# Patient Record
Sex: Male | Born: 1980 | Race: White | Hispanic: No | Marital: Single | Smoking: Current every day smoker
Health system: Southern US, Community
[De-identification: ages and names within clinical notes are randomized; demographics above are authoritative.]

---

## 2007-06-10 ENCOUNTER — Emergency Department: Payer: Self-pay | Admitting: Unknown Physician Specialty

## 2007-12-26 ENCOUNTER — Emergency Department: Payer: Self-pay | Admitting: Emergency Medicine

## 2008-04-25 ENCOUNTER — Emergency Department (HOSPITAL_COMMUNITY): Admission: EM | Admit: 2008-04-25 | Discharge: 2008-04-25 | Payer: Self-pay | Admitting: Emergency Medicine

## 2008-08-02 ENCOUNTER — Emergency Department: Payer: Self-pay | Admitting: Emergency Medicine

## 2008-10-14 ENCOUNTER — Emergency Department: Payer: Self-pay | Admitting: Emergency Medicine

## 2008-10-16 ENCOUNTER — Emergency Department: Payer: Self-pay | Admitting: Emergency Medicine

## 2010-01-21 ENCOUNTER — Emergency Department: Payer: Self-pay | Admitting: Emergency Medicine

## 2010-11-20 ENCOUNTER — Emergency Department: Payer: Self-pay | Admitting: Emergency Medicine

## 2010-12-21 ENCOUNTER — Emergency Department: Payer: Self-pay | Admitting: Emergency Medicine

## 2011-05-16 ENCOUNTER — Emergency Department: Payer: Self-pay | Admitting: Emergency Medicine

## 2014-03-30 ENCOUNTER — Emergency Department: Payer: Self-pay | Admitting: Emergency Medicine

## 2015-01-16 ENCOUNTER — Emergency Department: Payer: Self-pay

## 2015-01-16 ENCOUNTER — Emergency Department
Admission: EM | Admit: 2015-01-16 | Discharge: 2015-01-16 | Disposition: A | Discharge status: Home / Self Care | Payer: Self-pay | Attending: Emergency Medicine | Admitting: Emergency Medicine

## 2015-01-16 ENCOUNTER — Encounter: Payer: Self-pay | Admitting: Medical Oncology

## 2015-01-16 DIAGNOSIS — M791 Myalgia: Secondary | ICD-10-CM | POA: Insufficient documentation

## 2015-01-16 DIAGNOSIS — Z720 Tobacco use: Secondary | ICD-10-CM | POA: Insufficient documentation

## 2015-01-16 DIAGNOSIS — M542 Cervicalgia: Secondary | ICD-10-CM | POA: Insufficient documentation

## 2015-01-16 MED ORDER — DIAZEPAM 2 MG PO TABS: 2.0000 mg | ORAL_TABLET | Freq: Three times a day (TID) | ORAL | Status: AC | PRN

## 2015-01-16 MED ORDER — KETOROLAC TROMETHAMINE 60 MG/2ML IM SOLN
60.0000 mg | Freq: Once | INTRAMUSCULAR | Status: AC
  Administered 2015-01-16: 60 mg via INTRAMUSCULAR
  Filled 2015-01-16: qty 2

## 2015-01-16 MED ORDER — NAPROXEN 500 MG PO TABS: 500.0000 mg | ORAL_TABLET | Freq: Two times a day (BID) | ORAL | Status: AC

## 2015-01-16 NOTE — ED Provider Notes (Signed)
[facility] Emergency Department Provider Note ____________________________________________  Time seen: Approximately 10:39 AM  I have reviewed the triage vital signs and the nursing notes.   HISTORY  Chief Complaint Neck Pain  HPI Marc Navarro is a 34 y.o. male is here with complaint of neck pain 1 week. Patient denies any history of injury to his neck or prior neck problems. For the last 2 days he states that his neck is becoming more stiff and sore. He states there is a "pulling sensation" when he moves his neck from side to side. He denies any fever or chills, no headache, no nausea or vomiting. Denies any upper respiratory symptoms or sore throat. He has not taken any over-the-counter medication for this. Currently he rates his pain as a 7 out of 10.   History reviewed. No pertinent past medical history.  There are no active problems to display for this patient.   History reviewed. No pertinent past surgical history.  Current Outpatient Rx  Name  Route  Sig  Dispense  Refill  . diazepam (VALIUM) 2 MG tablet   Oral   Take 1 tablet (2 mg total) by mouth every 8 (eight) hours as needed for muscle spasms.   9 tablet   0   . naproxen (NAPROSYN) 500 MG tablet   Oral   Take 1 tablet (500 mg total) by mouth 2 (two) times daily with a meal.   30 tablet   0     Allergies Review of patient's allergies indicates no known allergies.  No family history on file.  Social History Social History  Substance Use Topics  . Smoking status: Current Every Day Smoker  . Smokeless tobacco: None  . Alcohol Use: None    Review of Systems Constitutional: No fever/chills Eyes: No visual changes. ENT: No sore throat. Cardiovascular: Denies chest pain. Respiratory: Denies shortness of breath. Gastrointestinal:  No nausea, no vomiting.   Musculoskeletal: Negative for back pain. Positive neck pain Skin: Negative for rash. Neurological: Negative for  headaches, focal weakness or numbness.  10-point ROS otherwise negative.  ____________________________________________   PHYSICAL EXAM:  VITAL SIGNS: ED Triage Vitals  Enc Vitals Group     BP 01/16/15 0921 143/95 mmHg     Pulse Rate 01/16/15 0921 88     Resp 01/16/15 0921 17     Temp 01/16/15 0921 97.8 F (36.6 C)     Temp Source 01/16/15 0921 Oral     SpO2 01/16/15 0921 98 %     Weight 01/16/15 0921 153 lb (69.4 kg)     Height 01/16/15 0921 6' (1.829 m)     Head Cir --      Peak Flow --      Pain Score 01/16/15 0921 7     Pain Loc --      Pain Edu? --      Excl. in GC? --     Constitutional: Alert and oriented. Well appearing and in no acute distress. Eyes: Conjunctivae are normal. PERRL. EOMI. Head: Atraumatic. Nose: No congestion/rhinnorhea. Mouth/Throat: Mucous membranes are moist.  Oropharynx non-erythematous. Neck: No stridor.  Mild tenderness on palpation of cervical spine C5-C6 area. There is tenderness on palpation of the left paravertebral muscles. Range of motion is slightly restricted secondary to discomfort. No gross deformity was noted. Cardiovascular: Normal rate, regular rhythm. Grossly normal heart sounds.  Good peripheral circulation. Respiratory: Normal respiratory effort.  No retractions. Lungs CTAB. Gastrointestinal: Soft and nontender. No distention.  No abdominal bruits. No CVA tenderness. Musculoskeletal: Moves upper and lower extremities without any difficulty. No lower extremity tenderness nor edema.  No joint effusions. Neurologic:  Normal speech and language. No gross focal neurologic deficits are appreciated. No gait instability. Skin:  Skin is warm, dry and intact. No rash noted. Psychiatric: Mood and affect are normal. Speech and behavior are normal.  ____________________________________________   LABS (all labs ordered are listed, but only abnormal results are displayed)  Labs Reviewed - No data to display  RADIOLOGY  X-rays cervical  spine shows no acute abnormality per radiologist. ____________________________________________   PROCEDURES  Procedure(s) performed: None  Critical Care performed: No  ____________________________________________   INITIAL IMPRESSION / ASSESSMENT AND PLAN / ED COURSE  Pertinent labs & imaging results that were available during my care of the patient were reviewed by me and considered in my medical decision making (see chart for details).  Patient was given Toradol 60 mg IM while in the emergency room. Also given a prescription for naproxen 500 mg twice a day and diazepam 2 mg one every 8 hours for muscle spasms #9 no refill. He is also to use warm compresses or ice to his neck and muscles as needed for discomfort. ____________________________________________   FINAL CLINICAL IMPRESSION(S) / ED DIAGNOSES  Final diagnoses:  Cervical muscle pain      Tommi Rumps, PA-C 01/16/15 1340  Emily Filbert, MD 01/16/15 [phone]

## 2015-01-16 NOTE — ED Notes (Signed)
Pt ambulatory to triage with reports of neck pain x 1 week. Denies injury.

## 2016-07-06 ENCOUNTER — Emergency Department
Admission: EM | Admit: 2016-07-06 | Discharge: 2016-07-06 | Disposition: A | Discharge status: Home / Self Care | Payer: Self-pay | Attending: Emergency Medicine | Admitting: Emergency Medicine

## 2016-07-06 ENCOUNTER — Emergency Department: Payer: Self-pay

## 2016-07-06 ENCOUNTER — Encounter: Payer: Self-pay | Admitting: Emergency Medicine

## 2016-07-06 DIAGNOSIS — S0003XA Contusion of scalp, initial encounter: Secondary | ICD-10-CM

## 2016-07-06 DIAGNOSIS — F17210 Nicotine dependence, cigarettes, uncomplicated: Secondary | ICD-10-CM | POA: Insufficient documentation

## 2016-07-06 DIAGNOSIS — Y929 Unspecified place or not applicable: Secondary | ICD-10-CM | POA: Insufficient documentation

## 2016-07-06 DIAGNOSIS — Z008 Encounter for other general examination: Secondary | ICD-10-CM

## 2016-07-06 DIAGNOSIS — Z79899 Other long term (current) drug therapy: Secondary | ICD-10-CM | POA: Insufficient documentation

## 2016-07-06 DIAGNOSIS — Y999 Unspecified external cause status: Secondary | ICD-10-CM | POA: Insufficient documentation

## 2016-07-06 DIAGNOSIS — Y9389 Activity, other specified: Secondary | ICD-10-CM | POA: Insufficient documentation

## 2016-07-06 DIAGNOSIS — Z0289 Encounter for other administrative examinations: Secondary | ICD-10-CM | POA: Insufficient documentation

## 2016-07-06 DIAGNOSIS — S0101XA Laceration without foreign body of scalp, initial encounter: Secondary | ICD-10-CM

## 2016-07-06 MED ORDER — ACETAMINOPHEN 325 MG PO TABS
650.0000 mg | ORAL_TABLET | Freq: Once | ORAL | Status: AC
  Administered 2016-07-06: 650 mg via ORAL
  Filled 2016-07-06: qty 2
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
